# Patient Record
Sex: Male | Born: 1940 | Race: White | Hispanic: No
Health system: Southern US, Community
[De-identification: ages and names within clinical notes are randomized; demographics above are authoritative.]

---

## 2013-09-16 ENCOUNTER — Inpatient Hospital Stay
Admission: AD | Admit: 2013-09-16 | Discharge: 2013-10-06 | Disposition: A | Discharge status: Home Health | Payer: Medicare Other | Source: Ambulatory Visit | Attending: Internal Medicine | Admitting: Internal Medicine

## 2013-09-17 ENCOUNTER — Other Ambulatory Visit (HOSPITAL_COMMUNITY): Payer: Medicare Other

## 2013-09-17 LAB — PROTIME-INR
INR: 1.4 (ref 0.00–1.49)
Prothrombin Time: 17.2 seconds — ABNORMAL HIGH (ref 11.6–15.2)

## 2013-09-17 LAB — CBC
HCT: 38.8 % — ABNORMAL LOW (ref 39.0–52.0)
HEMOGLOBIN: 10.9 g/dL — AB (ref 13.0–17.0)
MCH: 25.2 pg — ABNORMAL LOW (ref 26.0–34.0)
MCHC: 28.1 g/dL — AB (ref 30.0–36.0)
MCV: 89.8 fL (ref 78.0–100.0)
Platelets: 238 10*3/uL (ref 150–400)
RBC: 4.32 MIL/uL (ref 4.22–5.81)
RDW: 24.6 % — ABNORMAL HIGH (ref 11.5–15.5)
WBC: 9.1 10*3/uL (ref 4.0–10.5)

## 2013-09-17 LAB — URINALYSIS, ROUTINE W REFLEX MICROSCOPIC
Bilirubin Urine: NEGATIVE
Glucose, UA: NEGATIVE mg/dL
Hgb urine dipstick: NEGATIVE
Ketones, ur: NEGATIVE mg/dL
NITRITE: NEGATIVE
PH: 5.5 (ref 5.0–8.0)
Protein, ur: NEGATIVE mg/dL
Specific Gravity, Urine: 1.01 (ref 1.005–1.030)
Urobilinogen, UA: 1 mg/dL (ref 0.0–1.0)

## 2013-09-17 LAB — PRO B NATRIURETIC PEPTIDE: PRO B NATRI PEPTIDE: 16632 pg/mL — AB (ref 0–125)

## 2013-09-17 LAB — BLOOD GAS, ARTERIAL
Acid-Base Excess: 6.5 mmol/L — ABNORMAL HIGH (ref 0.0–2.0)
Bicarbonate: 31.2 mEq/L — ABNORMAL HIGH (ref 20.0–24.0)
DELIVERY SYSTEMS: POSITIVE
Expiratory PAP: 6
FIO2: 0.5 %
Inspiratory PAP: 12
Mode: POSITIVE
O2 Saturation: 98.1 %
PATIENT TEMPERATURE: 101.9
PCO2 ART: 56 mmHg — AB (ref 35.0–45.0)
TCO2: 32.8 mmol/L (ref 0–100)
pH, Arterial: 7.375 (ref 7.350–7.450)
pO2, Arterial: 89.1 mmHg (ref 80.0–100.0)

## 2013-09-17 LAB — URINE MICROSCOPIC-ADD ON

## 2013-09-17 LAB — TSH: TSH: 1.32 u[IU]/mL (ref 0.350–4.500)

## 2013-09-17 LAB — HEPATIC FUNCTION PANEL
ALBUMIN: 3.3 g/dL — AB (ref 3.5–5.2)
ALK PHOS: 38 U/L — AB (ref 39–117)
ALT: 35 U/L (ref 0–53)
AST: 28 U/L (ref 0–37)
Bilirubin, Direct: 0.6 mg/dL — ABNORMAL HIGH (ref 0.0–0.3)
Indirect Bilirubin: 0.9 mg/dL (ref 0.3–0.9)
Total Bilirubin: 1.5 mg/dL — ABNORMAL HIGH (ref 0.3–1.2)
Total Protein: 6.8 g/dL (ref 6.0–8.3)

## 2013-09-17 LAB — BASIC METABOLIC PANEL
ANION GAP: 16 — AB (ref 5–15)
BUN: 32 mg/dL — ABNORMAL HIGH (ref 6–23)
CO2: 29 mEq/L (ref 19–32)
Calcium: 9.1 mg/dL (ref 8.4–10.5)
Chloride: 107 mEq/L (ref 96–112)
Creatinine, Ser: 1.32 mg/dL (ref 0.50–1.35)
GFR calc non Af Amer: 52 mL/min — ABNORMAL LOW (ref >90)
GFR, EST AFRICAN AMERICAN: 61 mL/min — AB (ref >90)
Glucose, Bld: 165 mg/dL — ABNORMAL HIGH (ref 70–99)
POTASSIUM: 4.7 meq/L (ref 3.7–5.3)
Sodium: 152 mEq/L — ABNORMAL HIGH (ref 137–147)

## 2013-09-17 LAB — EXPECTORATED SPUTUM ASSESSMENT W GRAM STAIN, RFLX TO RESP C

## 2013-09-17 LAB — EXPECTORATED SPUTUM ASSESSMENT W REFEX TO RESP CULTURE

## 2013-09-17 LAB — DIGOXIN LEVEL: Digoxin Level: <0.3 ng/mL — ABNORMAL LOW (ref 0.8–2.0)

## 2013-09-17 LAB — APTT: APTT: 26 s (ref 24–37)

## 2013-09-18 LAB — BLOOD GAS, ARTERIAL
Acid-Base Excess: 7.7 mmol/L — ABNORMAL HIGH (ref 0.0–2.0)
BICARBONATE: 33.4 meq/L — AB (ref 20.0–24.0)
O2 CONTENT: 5 L/min
O2 SAT: 90.4 %
Patient temperature: 98.7
TCO2: 35.4 mmol/L (ref 0–100)
pCO2 arterial: 64 mmHg (ref 35.0–45.0)
pH, Arterial: 7.338 — ABNORMAL LOW (ref 7.350–7.450)
pO2, Arterial: 66 mmHg — ABNORMAL LOW (ref 80.0–100.0)

## 2013-09-18 LAB — CBC
HCT: 38.5 % — ABNORMAL LOW (ref 39.0–52.0)
Hemoglobin: 10.9 g/dL — ABNORMAL LOW (ref 13.0–17.0)
MCH: 24.9 pg — ABNORMAL LOW (ref 26.0–34.0)
MCHC: 28.3 g/dL — ABNORMAL LOW (ref 30.0–36.0)
MCV: 88.1 fL (ref 78.0–100.0)
Platelets: 224 10*3/uL (ref 150–400)
RBC: 4.37 MIL/uL (ref 4.22–5.81)
RDW: 24.6 % — ABNORMAL HIGH (ref 11.5–15.5)
WBC: 11.6 10*3/uL — ABNORMAL HIGH (ref 4.0–10.5)

## 2013-09-18 LAB — BASIC METABOLIC PANEL
Anion gap: 13 (ref 5–15)
BUN: 33 mg/dL — ABNORMAL HIGH (ref 6–23)
CHLORIDE: 106 meq/L (ref 96–112)
CO2: 30 mEq/L (ref 19–32)
CREATININE: 1.11 mg/dL (ref 0.50–1.35)
Calcium: 8.8 mg/dL (ref 8.4–10.5)
GFR calc Af Amer: 75 mL/min — ABNORMAL LOW (ref >90)
GFR calc non Af Amer: 64 mL/min — ABNORMAL LOW (ref >90)
Glucose, Bld: 122 mg/dL — ABNORMAL HIGH (ref 70–99)
Potassium: 4.5 mEq/L (ref 3.7–5.3)
Sodium: 149 mEq/L — ABNORMAL HIGH (ref 137–147)

## 2013-09-18 LAB — MAGNESIUM: MAGNESIUM: 2.6 mg/dL — AB (ref 1.5–2.5)

## 2013-09-18 LAB — PROCALCITONIN: Procalcitonin: <0.1 ng/mL

## 2013-09-19 LAB — CBC WITH DIFFERENTIAL/PLATELET
BASOS ABS: 0 10*3/uL (ref 0.0–0.1)
Basophils Relative: 0 % (ref 0–1)
EOS PCT: 0 % (ref 0–5)
Eosinophils Absolute: 0 10*3/uL (ref 0.0–0.7)
HCT: 36.3 % — ABNORMAL LOW (ref 39.0–52.0)
Hemoglobin: 10 g/dL — ABNORMAL LOW (ref 13.0–17.0)
Lymphocytes Relative: 2 % — ABNORMAL LOW (ref 12–46)
Lymphs Abs: 0.2 10*3/uL — ABNORMAL LOW (ref 0.7–4.0)
MCH: 24.8 pg — ABNORMAL LOW (ref 26.0–34.0)
MCHC: 27.5 g/dL — ABNORMAL LOW (ref 30.0–36.0)
MCV: 90.1 fL (ref 78.0–100.0)
Monocytes Absolute: 0.4 10*3/uL (ref 0.1–1.0)
Monocytes Relative: 4 % (ref 3–12)
NEUTROS PCT: 94 % — AB (ref 43–77)
Neutro Abs: 10.6 10*3/uL — ABNORMAL HIGH (ref 1.7–7.7)
Platelets: 203 10*3/uL (ref 150–400)
RBC: 4.03 MIL/uL — AB (ref 4.22–5.81)
RDW: 23.8 % — ABNORMAL HIGH (ref 11.5–15.5)
WBC: 11.2 10*3/uL — AB (ref 4.0–10.5)

## 2013-09-19 LAB — MAGNESIUM: Magnesium: 2.7 mg/dL — ABNORMAL HIGH (ref 1.5–2.5)

## 2013-09-19 LAB — BASIC METABOLIC PANEL
ANION GAP: 4 — AB (ref 5–15)
BUN: 25 mg/dL — ABNORMAL HIGH (ref 6–23)
CO2: 34 mEq/L — ABNORMAL HIGH (ref 19–32)
Calcium: 8 mg/dL — ABNORMAL LOW (ref 8.4–10.5)
Chloride: 106 mEq/L (ref 96–112)
Creatinine, Ser: 0.8 mg/dL (ref 0.50–1.35)
GFR, EST NON AFRICAN AMERICAN: 87 mL/min — AB (ref >90)
GLUCOSE: 182 mg/dL — AB (ref 70–99)
POTASSIUM: 4.5 meq/L (ref 3.7–5.3)
Sodium: 144 mEq/L (ref 137–147)

## 2013-09-19 LAB — PHOSPHORUS: PHOSPHORUS: 3.6 mg/dL (ref 2.3–4.6)

## 2013-09-19 LAB — VANCOMYCIN, TROUGH: Vancomycin Tr: 14.8 ug/mL (ref 10.0–20.0)

## 2013-09-19 NOTE — Progress Notes (Signed)
[facility]                                                                                              Progress note     Patient Demographics  Brad Thomas, is a 73 y.o. male  ZOX:[id number]  [id number]:[id number]  DOB - 1940-11-08  Admit date - 09/16/2013  Admitting Physician Elnora Morrison, MD  Outpatient Primary MD for the patient is No primary provider on file.  LOS - 3   CC  Resp failure  GI BLEED  Pna          Subjective:   Brad Thomas is confused  Objective:   Vital signs  Temperature 97.4 Heart rate 47 Respiratory rate 20 Blood pressure 121/82 Pulse ox 99%    Exam Awake confused [employer].AT,PERRAL Supple Neck,No JVD, No cervical lymphadenopathy appriciated.  Symmetrical Chest wall movement, scattered rhonchi  RRR,No Gallops,Rubs or new Murmurs, No Parasternal Heave +ve B.Sounds, Abd Soft, Non tender, No organomegaly appriciated, No rebound - guarding or rigidity. No Cyanosis, Clubbing or edema, No new Rash or bruise     I&Os unknown  Data Review   CBC  Recent Labs Lab 09/17/13 0340 09/18/13 0600 09/19/13 0534  WBC 9.1 11.6* 11.2*  HGB 10.9* 10.9* 10.0*  HCT 38.8* 38.5* 36.3*  PLT 238 224 203  MCV 89.8 88.1 90.1  MCH 25.2* 24.9* 24.8*  MCHC 28.1* 28.3* 27.5*  RDW 24.6* 24.6* 23.8*  LYMPHSABS  --   --  0.2*  MONOABS  --   --  0.4  EOSABS  --   --  0.0  BASOSABS  --   --  0.0    Chemistries   Recent Labs Lab 09/17/13 0340 09/18/13 0600 09/19/13 0534  NA 152* 149* 144  K 4.7 4.5 4.5  CL 107 106 106  CO2 29 30 34*  GLUCOSE 165* 122* 182*  BUN 32* 33* 25*  CREATININE 1.32 1.11 0.80  CALCIUM 9.1 8.8 8.0*  MG  --  2.6* 2.7*  AST 28  --   --   ALT 35  --   --   ALKPHOS 38*  --   --   BILITOT 1.5*  --   --    ------------------------------------------------------------------------------------------------------------------ CrCl is unknown because  there is no height on file for the current visit. ------------------------------------------------------------------------------------------------------------------ No results found for this basename: HGBA1C,  in the last 72 hours ------------------------------------------------------------------------------------------------------------------ No results found for this basename: CHOL, HDL, LDLCALC, TRIG, CHOLHDL, LDLDIRECT,  in the last 72 hours ------------------------------------------------------------------------------------------------------------------  Recent Labs  09/17/13 0340  TSH 1.320   ------------------------------------------------------------------------------------------------------------------ No results found for this basename: VITAMINB12, FOLATE, FERRITIN, TIBC, IRON, RETICCTPCT,  in the last 72 hours  Coagulation profile  Recent Labs Lab 09/17/13 0340  INR 1.40    No results found for this basename: DDIMER,  in the last 72 hours  Cardiac Enzymes No results found for this basename: CK, CKMB, TROPONINI, MYOGLOBIN,  in the last 168 hours ------------------------------------------------------------------------------------------------------------------ No components found with this basename: POCBNP,   Micro Results Recent Results (from the past 240 hour(s))  URINE CULTURE  Status: None   Collection Time    09/17/13  4:16 AM      Result Value Ref Range Status   Specimen Description URINE, CATHETERIZED   Final   Special Requests NONE   Final   Culture  Setup Time     Final   Value: 09/17/2013 12:51     [facility]formed [facility]   Colony Count     Final   Value: >=100,000 COLONIES/ML     [facility]   Culture     Final   Value: GRAM NEGATIVE RODS     [facility]formed [facility]   Report Status PENDING   Incomplete  CULTURE, EXPECTORATED SPUTUM-ASSESSMENT     Status: None   Collection Time    09/17/13  4:28 AM       Result Value Ref Range Status   Specimen Description SPUTUM   Final   Special Requests NONE   Final   Sputum evaluation     Final   Value: MICROSCOPIC FINDINGS SUGGEST THAT THIS SPECIMEN IS NOT REPRESENTATIVE OF LOWER RESPIRATORY SECRETIONS. PLEASE RECOLLECT.     NOTIFIED TO Purvis KiltsK. LOLLING (RN) [id number] 09/17/2013 L. LOMAX   Report Status 09/17/2013 FINAL   Final  CULTURE, BLOOD (SINGLE)     Status: None   Collection Time    09/17/13  5:10 AM      Result Value Ref Range Status   Specimen Description BLOOD RIGHT ANTECUBITAL   Final   Special Requests BOTTLES DRAWN AEROBIC ONLY 10CC   Final   Culture  Setup Time     Final   Value: 09/17/2013 11:07     [facility]formed at [facility]   Culture     Final   Value:        BLOOD CULTURE RECEIVED NO GROWTH TO DATE CULTURE WILL BE HELD FOR 5 DAYS BEFORE ISSUING A FINAL NEGATIVE REPORT     Performed at [facility]   Report Status PENDING   Incomplete  CULTURE, BLOOD (SINGLE)     Status: None   Collection Time    09/17/13  5:25 AM      Result Value Ref Range Status   Specimen Description BLOOD RIGHT HAND   Final   Special Requests BOTTLES DRAWN AEROBIC ONLY 9CC   Final   Culture  Setup Time     Final   Value: 09/17/2013 11:07     Performed at [facility]   Culture     Final   Value:        BLOOD CULTURE RECEIVED NO GROWTH TO DATE CULTURE WILL BE HELD FOR 5 DAYS BEFORE ISSUING A FINAL NEGATIVE REPORT     Performed at [facility]   Report Status PENDING   Incomplete       Assessment & Plan    Respiratory failure on nasal cannula 6 L per minute Aspiration pneumonia on IV antibiotics UTI on IV antibiotics Congestive heart failure with left ventricular dysfunction continue with beta blocker as digoxin and ACE inhibitors Severe anemia due to hemorrhagic gastritis with xarelto Advanced COPD oxygen dependent Delirium continue with olanzapine and Haldol Pulmonary embolism status post IVC filter not an anticoagulation  candidate Hyponatremia on D5 water resolved Coronary artery disease status post MI in 2011 with multiple stents Fever Seizure on Depakote Bradycardia  Plan  Hold digoxin Check BMP in a.m. D/C D5W    Code Status: Full     DVT  Prophylaxis  SCDs   Carron CurieHijazi, Cena Bruhn M.D on 09/19/2013 at 2:14 PM

## 2013-09-20 LAB — BASIC METABOLIC PANEL
Anion gap: 8 (ref 5–15)
BUN: 25 mg/dL — AB (ref 6–23)
CHLORIDE: 106 meq/L (ref 96–112)
CO2: 31 mEq/L (ref 19–32)
Calcium: 8.1 mg/dL — ABNORMAL LOW (ref 8.4–10.5)
Creatinine, Ser: 0.77 mg/dL (ref 0.50–1.35)
GFR calc non Af Amer: 89 mL/min — ABNORMAL LOW (ref >90)
Glucose, Bld: 197 mg/dL — ABNORMAL HIGH (ref 70–99)
POTASSIUM: 4.3 meq/L (ref 3.7–5.3)
SODIUM: 145 meq/L (ref 137–147)

## 2013-09-20 LAB — URINE CULTURE

## 2013-09-20 NOTE — Progress Notes (Addendum)
[facility]                                                                                              Progress note     Patient Demographics  Brad Thomas, is a 73 y.o. male  AOZ:[id number]  [id number]  DOB - [birth year]  Admit date - 09/16/2013  Admitting Physician Elnora MorrisonAhmad B Barakat, MD  Outpatient Primary MD for the patient is No primary provider on file.  LOS - 4   CC  Resp failure  GI BLEED  Pna          Subjective:   Brad Thomas is confused  Objective:   Vital signs  Temperature98 Heart rate 101 Respiratory rate 20 Blood pressure 140/62 Pulse ox 99%    Exam Awake confused [place].AT,PERRAL Supple Neck,No JVD, No cervical lymphadenopathy appriciated.  Symmetrical Chest wall movement, scattered rhonchi  RRR,No Gallops,Rubs or new Murmurs, No Parasternal Heave +ve B.Sounds, Abd Soft, Non tender, No organomegaly appriciated, No rebound - guarding or rigidity. No Cyanosis, Clubbing or edema, No new Rash or bruise     I&Os +470  Data Review   CBC  Recent Labs Lab 09/17/13 0340 09/18/13 0600 09/19/13 0534  WBC 9.1 11.6* 11.2*  HGB 10.9* 10.9* 10.0*  HCT 38.8* 38.5* 36.3*  PLT 238 224 203  MCV 89.8 88.1 90.1  MCH 25.2* 24.9* 24.8*  MCHC 28.1* 28.3* 27.5*  RDW 24.6* 24.6* 23.8*  LYMPHSABS  --   --  0.2*  MONOABS  --   --  0.4  EOSABS  --   --  0.0  BASOSABS  --   --  0.0    Chemistries   Recent Labs Lab 09/17/13 0340 09/18/13 0600 09/19/13 0534 09/20/13 0925  NA 152* 149* 144 145  K 4.7 4.5 4.5 4.3  CL 107 106 106 106  CO2 29 30 34* 31  GLUCOSE 165* 122* 182* 197*  BUN 32* 33* 25* 25*  CREATININE 1.32 1.11 0.80 0.77  CALCIUM 9.1 8.8 8.0* 8.1*  MG  --  2.6* 2.7*  --   AST 28  --   --   --   ALT 35  --   --   --   ALKPHOS 38*  --   --   --   BILITOT 1.5*  --   --   --     ------------------------------------------------------------------------------------------------------------------ CrCl is unknown because there is no height on file for the current visit. ------------------------------------------------------------------------------------------------------------------ No results found for this basename: HGBA1C,  in the last 72 hours ------------------------------------------------------------------------------------------------------------------ No results found for this basename: CHOL, HDL, LDLCALC, TRIG, CHOLHDL, LDLDIRECT,  in the last 72 hours ------------------------------------------------------------------------------------------------------------------ No results found for this basename: TSH, T4TOTAL, FREET3, T3FREE, THYROIDAB,  in the last 72 hours ------------------------------------------------------------------------------------------------------------------ No results found for this basename: VITAMINB12, FOLATE, FERRITIN, TIBC, IRON, RETICCTPCT,  in the last 72 hours  Coagulation profile  Recent Labs Lab 09/17/13 0340  INR 1.40    No results found for this basename: DDIMER,  in the last 72 hours  Cardiac Enzymes No results found for this basename: CK, CKMB, TROPONINI,  MYOGLOBIN,  in the last 168 hours ------------------------------------------------------------------------------------------------------------------ No components found with this basename: POCBNP,   Micro Results Recent Results (from the past 240 hour(s))  URINE CULTURE     Status: None   Collection Time    09/17/13  4:16 AM      Result Value Ref Range Status   Specimen Description URINE, CATHETERIZED   Final   Special Requests NONE   Final   Culture  Setup Time     Final   Value: 09/17/2013 12:51     Performed at [facility] Count     Final   Value: >=100,000 COLONIES/ML     Performed at [facility]   Culture     Final   Value:  KLEBSIELLA PNEUMONIAE     Performed at [facility]   Report Status 09/20/2013 FINAL   Final   Organism ID, Bacteria KLEBSIELLA PNEUMONIAE   Final  CULTURE, EXPECTORATED SPUTUM-ASSESSMENT     Status: None   Collection Time    09/17/13  4:28 AM      Result Value Ref Range Status   Specimen Description SPUTUM   Final   Special Requests NONE   Final   Sputum evaluation     Final   Value: MICROSCOPIC FINDINGS SUGGEST THAT THIS SPECIMEN IS NOT REPRESENTATIVE OF LOWER RESPIRATORY SECRETIONS. PLEASE RECOLLECT.     NOTIFIED TO Purvis Kilts (RN) [id number] 09/17/2013 L. LOMAX   Report Status 09/17/2013 FINAL   Final  CULTURE, BLOOD (SINGLE)     Status: None   Collection Time    09/17/13  5:10 AM      Result Value Ref Range Status   Specimen Description BLOOD RIGHT ANTECUBITAL   Final   Special Requests BOTTLES DRAWN AEROBIC ONLY 10CC   Final   Culture  Setup Time     Final   Value: 09/17/2013 11:07     Performed at [facility]   Culture     Final   Value:        BLOOD CULTURE RECEIVED NO GROWTH TO DATE CULTURE WILL BE HELD FOR 5 DAYS BEFORE ISSUING A FINAL NEGATIVE REPORT     Performed at [facility]   Report Status PENDING   Incomplete  CULTURE, BLOOD (SINGLE)     Status: None   Collection Time    09/17/13  5:25 AM      Result Value Ref Range Status   Specimen Description BLOOD RIGHT HAND   Final   Special Requests BOTTLES DRAWN AEROBIC ONLY 9CC   Final   Culture  Setup Time     Final   Value: 09/17/2013 11:07     Performed at [facility]   Culture     Final   Value:        BLOOD CULTURE RECEIVED NO GROWTH TO DATE CULTURE WILL BE HELD FOR 5 DAYS BEFORE ISSUING A FINAL NEGATIVE REPORT     Performed at [facility]   Report Status PENDING   Incomplete       Assessment & Plan    Respiratory failure on nasal cannula 6 L per minute Aspiration pneumonia on IV antibiotics UTI on IV antibiotics Congestive heart failure with left ventricular  dysfunction continue with beta blocker as digoxin and ACE inhibitors Severe anemia due to hemorrhagic gastritis with xarelto Advanced COPD oxygen dependent Delirium continue with olanzapine and Haldol Pulmonary embolism status post IVC filter not an anticoagulation candidate  Hyponatremia on D5 water resolved Coronary artery disease status post MI in 2011 with multiple stents Fever Seizure on Depakote Bradycardia digoxin is decreased  Plan  D/W ID Discontinue meropenem and vancomycin Start Rocephin 1 g IV daily Discontinue Foley Critical care time 32 minutes  Code Status: Full     DVT Prophylaxis  SCDs   Carron CurieHijazi, Helmi Hechavarria M.D on 09/20/2013 at 1:23 PM

## 2013-09-21 LAB — PROTEIN ELECTROPHORESIS, SERUM
ALBUMIN ELP: 53.7 % — AB (ref 55.8–66.1)
Alpha-1-Globulin: 4.5 % (ref 2.9–4.9)
Alpha-2-Globulin: 5.3 % — ABNORMAL LOW (ref 7.1–11.8)
BETA 2: 16 % — AB (ref 3.2–6.5)
Beta Globulin: 10 % — ABNORMAL HIGH (ref 4.7–7.2)
Gamma Globulin: 10.5 % — ABNORMAL LOW (ref 11.1–18.8)
M-Spike, %: NOT DETECTED g/dL
TOTAL PROTEIN ELP: 5.3 g/dL — AB (ref 6.0–8.3)

## 2013-09-21 LAB — VANCOMYCIN, TROUGH: VANCOMYCIN TR: 11.1 ug/mL (ref 10.0–20.0)

## 2013-09-21 NOTE — Progress Notes (Signed)
[facility]                                                                                              Progress note     Patient Demographics  Brad Thomas, is a 73 y.o. male  ZOX:[id number]  WJX:[id number]  DOB - [birth year]  Admit date - 09/16/2013  Admitting Physician Elnora MorrisonAhmad B Barakat, MD  Outpatient Primary MD for the patient is No primary provider on file.  LOS - 5   CC  Resp failure  GI BLEED  Pna          Subjective:   Brad Thomas is confused  Objective:   Vital signs  Temperature 98.4 Heart rate 64 Respiratory rate 18 Blood pressure 157/75 Pulse ox 93%    Exam Awake confused [place].AT,PERRAL Supple Neck,No JVD, No cervical lymphadenopathy appriciated.  Symmetrical Chest wall movement, decreased breath sounds, no rhonchi or wheezing  RRR,No Gallops,Rubs or new Murmurs, No Parasternal Heave +ve B.Sounds, Abd Soft, Non tender, No organomegaly appriciated, No rebound - guarding or rigidity. No Cyanosis, Clubbing or edema, No new Rash or bruise     I&Os -1200  Data Review   CBC  Recent Labs Lab 09/17/13 0340 09/18/13 0600 09/19/13 0534  WBC 9.1 11.6* 11.2*  HGB 10.9* 10.9* 10.0*  HCT 38.8* 38.5* 36.3*  PLT 238 224 203  MCV 89.8 88.1 90.1  MCH 25.2* 24.9* 24.8*  MCHC 28.1* 28.3* 27.5*  RDW 24.6* 24.6* 23.8*  LYMPHSABS  --   --  0.2*  MONOABS  --   --  0.4  EOSABS  --   --  0.0  BASOSABS  --   --  0.0    Chemistries   Recent Labs Lab 09/17/13 0340 09/18/13 0600 09/19/13 0534 09/20/13 0925  NA 152* 149* 144 145  K 4.7 4.5 4.5 4.3  CL 107 106 106 106  CO2 29 30 34* 31  GLUCOSE 165* 122* 182* 197*  BUN 32* 33* 25* 25*  CREATININE 1.32 1.11 0.80 0.77  CALCIUM 9.1 8.8 8.0* 8.1*  MG  --  2.6* 2.7*  --   AST 28  --   --   --   ALT 35  --   --   --   ALKPHOS 38*  --   --   --   BILITOT 1.5*  --   --   --     ------------------------------------------------------------------------------------------------------------------ CrCl is unknown because there is no height on file for the current visit. ------------------------------------------------------------------------------------------------------------------ No results found for this basename: HGBA1C,  in the last 72 hours ------------------------------------------------------------------------------------------------------------------ No results found for this basename: CHOL, HDL, LDLCALC, TRIG, CHOLHDL, LDLDIRECT,  in the last 72 hours ------------------------------------------------------------------------------------------------------------------ No results found for this basename: TSH, T4TOTAL, FREET3, T3FREE, THYROIDAB,  in the last 72 hours ------------------------------------------------------------------------------------------------------------------ No results found for this basename: VITAMINB12, FOLATE, FERRITIN, TIBC, IRON, RETICCTPCT,  in the last 72 hours  Coagulation profile  Recent Labs Lab 09/17/13 0340  INR 1.40    No results found for this basename: DDIMER,  in the last 72 hours  Cardiac Enzymes No results found  for this basename: CK, CKMB, TROPONINI, MYOGLOBIN,  in the last 168 hours ------------------------------------------------------------------------------------------------------------------ No components found with this basename: POCBNP,   Micro Results Recent Results (from the past 240 hour(s))  URINE CULTURE     Status: None   Collection Time    09/17/13  4:16 AM      Result Value Ref Range Status   Specimen Description URINE, CATHETERIZED   Final   Special Requests NONE   Final   Culture  Setup Time     Final   Value: 09/17/2013 12:51     Performed at [facility]   Colony Count     Final   Value: >=100,000 COLONIES/ML     Performed at [facility]   Culture     Final   Value:  KLEBSIELLA PNEUMONIAE     Performed at [facility]   Report Status 09/20/2013 FINAL   Final   Organism ID, Bacteria KLEBSIELLA PNEUMONIAE   Final  CULTURE, EXPECTORATED SPUTUM-ASSESSMENT     Status: None   Collection Time    09/17/13  4:28 AM      Result Value Ref Range Status   Specimen Description SPUTUM   Final   Special Requests NONE   Final   Sputum evaluation     Final   Value: MICROSCOPIC FINDINGS SUGGEST THAT THIS SPECIMEN IS NOT REPRESENTATIVE OF LOWER RESPIRATORY SECRETIONS. PLEASE RECOLLECT.     NOTIFIED TO Purvis KiltsK. LOLLING (RN) [phone] 09/17/2013 L. LOMAX   Report Status 09/17/2013 FINAL   Final  CULTURE, BLOOD (SINGLE)     Status: None   Collection Time    09/17/13  5:10 AM      Result Value Ref Range Status   Specimen Description BLOOD RIGHT ANTECUBITAL   Final   Special Requests BOTTLES DRAWN AEROBIC ONLY 10CC   Final   Culture  Setup Time     Final   Value: 09/17/2013 11:07     Performed at [facility]   Culture     Final   Value:        BLOOD CULTURE RECEIVED NO GROWTH TO DATE CULTURE WILL BE HELD FOR 5 DAYS BEFORE ISSUING A FINAL NEGATIVE REPORT     Performed at [facility]   Report Status PENDING   Incomplete  CULTURE, BLOOD (SINGLE)     Status: None   Collection Time    09/17/13  5:25 AM      Result Value Ref Range Status   Specimen Description BLOOD RIGHT HAND   Final   Special Requests BOTTLES DRAWN AEROBIC ONLY 9CC   Final   Culture  Setup Time     Final   Value: 09/17/2013 11:07     Performed at [facility]   Culture     Final   Value:        BLOOD CULTURE RECEIVED NO GROWTH TO DATE CULTURE WILL BE HELD FOR 5 DAYS BEFORE ISSUING A FINAL NEGATIVE REPORT     Performed at [facility]   Report Status PENDING   Incomplete       Assessment & Plan    Respiratory failure on nasal cannula 6 L per minute Aspiration pneumonia on IV antibiotics UTI on IV antibiotics Congestive heart failure with left ventricular  dysfunction continue with beta blocker as digoxin and ACE inhibitors Severe anemia due to hemorrhagic gastritis with xarelto Advanced COPD oxygen dependent Delirium continue with olanzapine and Haldol Pulmonary embolism status post  IVC filter not an anticoagulation candidate Hyponatremia on D5 water resolved Coronary artery disease status post MI in 2011 with multiple stents Fever Seizure on Depakote Bradycardia digoxin is decreased  Plan  Continue same  Code Status: Full     DVT Prophylaxis  SCDs   Carron Curie M.D on 09/21/2013 at 1:15 PM

## 2013-09-22 ENCOUNTER — Other Ambulatory Visit (HOSPITAL_COMMUNITY): Payer: Medicare Other

## 2013-09-22 LAB — CBC
HCT: 36.7 % — ABNORMAL LOW (ref 39.0–52.0)
Hemoglobin: 10.6 g/dL — ABNORMAL LOW (ref 13.0–17.0)
MCH: 24.9 pg — ABNORMAL LOW (ref 26.0–34.0)
MCHC: 28.9 g/dL — ABNORMAL LOW (ref 30.0–36.0)
MCV: 86.2 fL (ref 78.0–100.0)
Platelets: 242 10*3/uL (ref 150–400)
RBC: 4.26 MIL/uL (ref 4.22–5.81)
RDW: 24.1 % — AB (ref 11.5–15.5)
WBC: 11.5 10*3/uL — ABNORMAL HIGH (ref 4.0–10.5)

## 2013-09-22 LAB — BASIC METABOLIC PANEL
ANION GAP: 8 (ref 5–15)
BUN: 24 mg/dL — ABNORMAL HIGH (ref 6–23)
CO2: 32 meq/L (ref 19–32)
CREATININE: 0.86 mg/dL (ref 0.50–1.35)
Calcium: 8.3 mg/dL — ABNORMAL LOW (ref 8.4–10.5)
Chloride: 100 mEq/L (ref 96–112)
GFR calc non Af Amer: 85 mL/min — ABNORMAL LOW (ref >90)
Glucose, Bld: 86 mg/dL (ref 70–99)
Potassium: 4.2 mEq/L (ref 3.7–5.3)
Sodium: 140 mEq/L (ref 137–147)

## 2013-09-22 NOTE — Progress Notes (Signed)
[facility]                                                                                              Progress note     Patient Demographics  Brad Thomas, is a 73 y.o. male  ZOX:[id number]  WJX:[id number]  DOB - April 20, 1940  Admit date - 09/16/2013  Admitting Physician Elnora Morrison, MD  Outpatient Primary MD for the patient is No primary provider on file.  LOS - 6   CC  Resp failure  GI BLEED  Pna          Subjective:   Brad Thomas is confused  Objective:   Vital signs  Temperature 98.1 Heart rate 54 Respiratory rate 18 Blood pressure 122/70 Pulse ox 95%    Exam Awake confused [place].AT,PERRAL Supple Neck,No JVD, No cervical lymphadenopathy appriciated.  Symmetrical Chest wall movement, decreased breath sounds, no rhonchi or wheezing  RRR,No Gallops,Rubs or new Murmurs, No Parasternal Heave +ve B.Sounds, Abd Soft, Non tender, No organomegaly appriciated, No rebound - guarding or rigidity. No Cyanosis, Clubbing or edema, No new Rash or bruise     I&Os -1200  Data Review   CBC  Recent Labs Lab 09/17/13 0340 09/18/13 0600 09/19/13 0534 09/22/13 0621  WBC 9.1 11.6* 11.2* 11.5*  HGB 10.9* 10.9* 10.0* 10.6*  HCT 38.8* 38.5* 36.3* 36.7*  PLT 238 224 203 242  MCV 89.8 88.1 90.1 86.2  MCH 25.2* 24.9* 24.8* 24.9*  MCHC 28.1* 28.3* 27.5* 28.9*  RDW 24.6* 24.6* 23.8* 24.1*  LYMPHSABS  --   --  0.2*  --   MONOABS  --   --  0.4  --   EOSABS  --   --  0.0  --   BASOSABS  --   --  0.0  --     Chemistries   Recent Labs Lab 09/17/13 0340 09/18/13 0600 09/19/13 0534 09/20/13 0925 09/22/13 0621  NA 152* 149* 144 145 140  K 4.7 4.5 4.5 4.3 4.2  CL 107 106 106 106 100  CO2 29 30 34* 31 32  GLUCOSE 165* 122* 182* 197* 86  BUN 32* 33* 25* 25* 24*  CREATININE 1.32 1.11 0.80 0.77 0.86  CALCIUM 9.1 8.8 8.0* 8.1* 8.3*  MG  --  2.6* 2.7*  --   --   AST 28  --   --    --   --   ALT 35  --   --   --   --   ALKPHOS 38*  --   --   --   --   BILITOT 1.5*  --   --   --   --    ------------------------------------------------------------------------------------------------------------------ CrCl is unknown because there is no height on file for the current visit. ------------------------------------------------------------------------------------------------------------------ No results found for this basename: HGBA1C,  in the last 72 hours ------------------------------------------------------------------------------------------------------------------ No results found for this basename: CHOL, HDL, LDLCALC, TRIG, CHOLHDL, LDLDIRECT,  in the last 72 hours ------------------------------------------------------------------------------------------------------------------ No results found for this basename: TSH, T4TOTAL, FREET3, T3FREE, THYROIDAB,  in the last 72 hours ------------------------------------------------------------------------------------------------------------------ No results found for this  basename: VITAMINB12, FOLATE, FERRITIN, TIBC, IRON, RETICCTPCT,  in the last 72 hours  Coagulation profile  Recent Labs Lab 09/17/13 0340  INR 1.40    No results found for this basename: DDIMER,  in the last 72 hours  Cardiac Enzymes No results found for this basename: CK, CKMB, TROPONINI, MYOGLOBIN,  in the last 168 hours ------------------------------------------------------------------------------------------------------------------ No components found with this basename: POCBNP,   Micro Results Recent Results (from the past 240 hour(s))  URINE CULTURE     Status: None   Collection Time    09/17/13  4:16 AM      Result Value Ref Range Status   Specimen Description URINE, CATHETERIZED   Final   Special Requests NONE   Final   Culture  Setup Time     Final   Value: 09/17/2013 12:51     Performed at [facility] Count     Final    Value: >=100,000 COLONIES/ML     Performed at [facility]   Culture     Final   Value: KLEBSIELLA PNEUMONIAE     Performed at [facility]   Report Status 09/20/2013 FINAL   Final   Organism ID, Bacteria KLEBSIELLA PNEUMONIAE   Final  CULTURE, EXPECTORATED SPUTUM-ASSESSMENT     Status: None   Collection Time    09/17/13  4:28 AM      Result Value Ref Range Status   Specimen Description SPUTUM   Final   Special Requests NONE   Final   Sputum evaluation     Final   Value: MICROSCOPIC FINDINGS SUGGEST THAT THIS SPECIMEN IS NOT REPRESENTATIVE OF LOWER RESPIRATORY SECRETIONS. PLEASE RECOLLECT.     NOTIFIED TO Purvis Kilts (RN) [phone] 09/17/2013 L. LOMAX   Report Status 09/17/2013 FINAL   Final  CULTURE, BLOOD (SINGLE)     Status: None   Collection Time    09/17/13  5:10 AM      Result Value Ref Range Status   Specimen Description BLOOD RIGHT ANTECUBITAL   Final   Special Requests BOTTLES DRAWN AEROBIC ONLY 10CC   Final   Culture  Setup Time     Final   Value: 09/17/2013 11:07     Performed at [facility]   Culture     Final   Value:        BLOOD CULTURE RECEIVED NO GROWTH TO DATE CULTURE WILL BE HELD FOR 5 DAYS BEFORE ISSUING A FINAL NEGATIVE REPORT     Performed at [facility]   Report Status PENDING   Incomplete  CULTURE, BLOOD (SINGLE)     Status: None   Collection Time    09/17/13  5:25 AM      Result Value Ref Range Status   Specimen Description BLOOD RIGHT HAND   Final   Special Requests BOTTLES DRAWN AEROBIC ONLY 9CC   Final   Culture  Setup Time     Final   Value: 09/17/2013 11:07     Performed at [facility]   Culture     Final   Value:        BLOOD CULTURE RECEIVED NO GROWTH TO DATE CULTURE WILL BE HELD FOR 5 DAYS BEFORE ISSUING A FINAL NEGATIVE REPORT     Performed at [facility]   Report Status PENDING   Incomplete       Assessment & Plan    Respiratory failure on nasal cannula 6 L per minute Aspiration  pneumonia on IV antibiotics UTI on IV antibiotics Congestive heart failure with left ventricular dysfunction continue with beta blocker as digoxin and ACE inhibitors ,& diuresis Severe anemia due to hemorrhagic gastritis with xarelto Advanced COPD oxygen dependent Delirium continue with olanzapine and Haldol Pulmonary embolism status post IVC filter not an anticoagulation candidate Hyponatremia on D5 water resolved Coronary artery disease status post MI in 2011 with multiple stents Fever Seizure on Depakote Bradycardia ; digoxin is decreased  Plan  Continue same  Code Status: Full  DVT Prophylaxis  SCDs   Carron CurieHijazi, Medora Roorda M.D on 09/22/2013 at 12:32 PM

## 2013-09-23 LAB — CULTURE, BLOOD (SINGLE)
CULTURE: NO GROWTH
Culture: NO GROWTH

## 2013-09-23 NOTE — Progress Notes (Signed)
[facility]                                                                                              Progress note     Patient Demographics  Brad Thomas, is a 73 y.o. male  ZOX:[id number]  WJX:[id number]  DOB - 1941-02-12  Admit date - 09/16/2013  Admitting Physician Elnora Morrison, MD  Outpatient Primary MD for the patient is No primary provider on file.  LOS - 7   CC  Resp failure  GI BLEED  Pna          Subjective:   Brad Thomas has no complaints, family at bedside  Objective:   Vital signs  Temperature 96.9 Heart rate 63 Respiratory rate 20 Blood pressure 117/77 Pulse ox 98%    Exam Awake confused [place].AT,PERRAL Supple Neck,No JVD, No cervical lymphadenopathy appriciated.  Symmetrical Chest wall movement, decreased breath sounds, no rhonchi or wheezing  RRR,No Gallops,Rubs or new Murmurs, No Parasternal Heave +ve B.Sounds, Abd Soft, Non tender, No organomegaly appriciated, No rebound - guarding or rigidity. No Cyanosis, Clubbing or edema, No new Rash or bruise     I&Os -1200  Data Review   CBC  Recent Labs Lab 09/17/13 0340 09/18/13 0600 09/19/13 0534 09/22/13 0621  WBC 9.1 11.6* 11.2* 11.5*  HGB 10.9* 10.9* 10.0* 10.6*  HCT 38.8* 38.5* 36.3* 36.7*  PLT 238 224 203 242  MCV 89.8 88.1 90.1 86.2  MCH 25.2* 24.9* 24.8* 24.9*  MCHC 28.1* 28.3* 27.5* 28.9*  RDW 24.6* 24.6* 23.8* 24.1*  LYMPHSABS  --   --  0.2*  --   MONOABS  --   --  0.4  --   EOSABS  --   --  0.0  --   BASOSABS  --   --  0.0  --     Chemistries   Recent Labs Lab 09/17/13 0340 09/18/13 0600 09/19/13 0534 09/20/13 0925 09/22/13 0621  NA 152* 149* 144 145 140  K 4.7 4.5 4.5 4.3 4.2  CL 107 106 106 106 100  CO2 29 30 34* 31 32  GLUCOSE 165* 122* 182* 197* 86  BUN 32* 33* 25* 25* 24*  CREATININE 1.32 1.11 0.80 0.77 0.86  CALCIUM 9.1 8.8 8.0* 8.1* 8.3*  MG  --  2.6* 2.7*  --    --   AST 28  --   --   --   --   ALT 35  --   --   --   --   ALKPHOS 38*  --   --   --   --   BILITOT 1.5*  --   --   --   --    ------------------------------------------------------------------------------------------------------------------ CrCl is unknown because there is no height on file for the current visit. ------------------------------------------------------------------------------------------------------------------ No results found for this basename: HGBA1C,  in the last 72 hours ------------------------------------------------------------------------------------------------------------------ No results found for this basename: CHOL, HDL, LDLCALC, TRIG, CHOLHDL, LDLDIRECT,  in the last 72 hours ------------------------------------------------------------------------------------------------------------------ No results found for this basename: TSH, T4TOTAL, FREET3, T3FREE, THYROIDAB,  in the last 72 hours ------------------------------------------------------------------------------------------------------------------ No  results found for this basename: VITAMINB12, FOLATE, FERRITIN, TIBC, IRON, RETICCTPCT,  in the last 72 hours  Coagulation profile  Recent Labs Lab 09/17/13 0340  INR 1.40    No results found for this basename: DDIMER,  in the last 72 hours  Cardiac Enzymes No results found for this basename: CK, CKMB, TROPONINI, MYOGLOBIN,  in the last 168 hours ------------------------------------------------------------------------------------------------------------------ No components found with this basename: POCBNP,   Micro Results Recent Results (from the past 240 hour(s))  URINE CULTURE     Status: None   Collection Time    09/17/13  4:16 AM      Result Value Ref Range Status   Specimen Description URINE, CATHETERIZED   Final   Special Requests NONE   Final   Culture  Setup Time     Final   Value: 09/17/2013 12:51     Performed at [facility]    Colony Count     Final   Value: >=100,000 COLONIES/ML     [facility]   Culture     Final   Value: KLEBSIELLA PNEUMONIAE     [facility]formed [facility]   Report Status 09/20/2013 FINAL   Final   Organism ID, Bacteria KLEBSIELLA PNEUMONIAE   Final  CULTURE, EXPECTORATED SPUTUM-ASSESSMENT     Status: None   Collection Time    09/17/13  4:28 AM      Result Value Ref Range Status   Specimen Description SPUTUM   Final   Special Requests NONE   Final   Sputum evaluation     Final   Value: MICROSCOPIC FINDINGS SUGGEST THAT THIS SPECIMEN IS NOT REPRESENTATIVE OF LOWER RESPIRATORY SECRETIONS. PLEASE RECOLLECT.     NOTIFIED TO Purvis KiltsK. LOLLING (RN) [phone] 09/17/2013 L. LOMAX   Report Status 09/17/2013 FINAL   Final  CULTURE, BLOOD (SINGLE)     Status: None   Collection Time    09/17/13  5:10 AM      Result Value Ref Range Status   Specimen Description BLOOD RIGHT ANTECUBITAL   Final   Special Requests BOTTLES DRAWN AEROBIC ONLY 10CC   Final   Culture  Setup Time     Final   Value: 09/17/2013 11:07     [facility]formed [facility]   Culture     Final   Value: NO GROWTH 5 DAYS     [facility]formed at [facility]   Report Status 09/23/2013 FINAL   Final  CULTURE, BLOOD (SINGLE)     Status: None   Collection Time    09/17/13  5:25 AM      Result Value Ref Range Status   Specimen Description BLOOD RIGHT HAND   Final   Special Requests BOTTLES DRAWN AEROBIC ONLY 9CC   Final   Culture  Setup Time     Final   Value: 09/17/2013 11:07     [facility]formed [facility]   Culture     Final   Value: NO GROWTH 5 DAYS     [facility]   Report Status 09/23/2013 FINAL   Final       Assessment & Plan    Respiratory failure on nasal cannula 6 L per minute Aspiration pneumonia on IV antibiotics UTI on IV antibiotics 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

## 2013-09-24 NOTE — Progress Notes (Signed)
[facility]                                                                                              Progress note     Patient Demographics  Brad Thomas, is a 73 y.o. male  BJY:[id number]  YQM:[id number]  DOB - 18-Oct-1940  Admit date - 09/16/2013  Admitting Physician Elnora MorrisonAhmad B Barakat, MD  Outpatient Primary MD for the patient is No primary provider on file.  LOS - 8   CC  Resp failure  GI BLEED  Pna          Subjective:   Brad Thomas has no complaints, family at bedside  Objective:   Vital signs  Temperature 97.6 Heart rate 50 Respiratory rate 20 Blood pressure 121/60 Pulse ox 97%    Exam Awake confused .AT,PERRAL Supple Neck,No JVD, No cervical lymphadenopathy appriciated.  Symmetrical Chest wall movement, decreased breath sounds, no rhonchi or wheezing  RRR,No Gallops,Rubs or new Murmurs, No Parasternal Heave +ve B.Sounds, Abd Soft, Non tender, No organomegaly appriciated, No rebound - guarding or rigidity. No Cyanosis, Clubbing or edema, No new Rash or bruise     I&Os -580  Data Review   CBC  Recent Labs Lab 09/18/13 0600 09/19/13 0534 09/22/13 0621  WBC 11.6* 11.2* 11.5*  HGB 10.9* 10.0* 10.6*  HCT 38.5* 36.3* 36.7*  PLT 224 203 242  MCV 88.1 90.1 86.2  MCH 24.9* 24.8* 24.9*  MCHC 28.3* 27.5* 28.9*  RDW 24.6* 23.8* 24.1*  LYMPHSABS  --  0.2*  --   MONOABS  --  0.4  --   EOSABS  --  0.0  --   BASOSABS  --  0.0  --     Chemistries   Recent Labs Lab 09/18/13 0600 09/19/13 0534 09/20/13 0925 09/22/13 0621  NA 149* 144 145 140  K 4.5 4.5 4.3 4.2  CL 106 106 106 100  CO2 30 34* 31 32  GLUCOSE 122* 182* 197* 86  BUN 33* 25* 25* 24*  CREATININE 1.11 0.80 0.77 0.86  CALCIUM 8.8 8.0* 8.1* 8.3*  MG 2.6* 2.7*  --   --    ------------------------------------------------------------------------------------------------------------------ CrCl is  unknown because there is no height on file for the current visit. ------------------------------------------------------------------------------------------------------------------ No results found for this basename: HGBA1C,  in the last 72 hours ------------------------------------------------------------------------------------------------------------------ No results found for this basename: CHOL, HDL, LDLCALC, TRIG, CHOLHDL, LDLDIRECT,  in the last 72 hours ------------------------------------------------------------------------------------------------------------------ No results found for this basename: TSH, T4TOTAL, FREET3, T3FREE, THYROIDAB,  in the last 72 hours ------------------------------------------------------------------------------------------------------------------ No results found for this basename: VITAMINB12, FOLATE, FERRITIN, TIBC, IRON, RETICCTPCT,  in the last 72 hours  Coagulation profile No results found for this basename: INR, PROTIME,  in the last 168 hours  No results found for this basename: DDIMER,  in the last 72 hours  Cardiac Enzymes No results found for this basename: CK, CKMB, TROPONINI, MYOGLOBIN,  in the last 168 hours ------------------------------------------------------------------------------------------------------------------ No components found with this basename: POCBNP,   Micro Results Recent Results (from the past 240 hour(s))  URINE CULTURE     Status: None  Collection Time    09/17/13  4:16 AM      Result Value Ref Range Status   Specimen Description URINE, CATHETERIZED   Final   Special Requests NONE   Final   Culture  Setup Time     Final   Value: 09/17/2013 12:51     Performed at [facility] Count     Final   Value: >=100,000 COLONIES/ML     Performed at [facility]   Culture     Final   Value: KLEBSIELLA PNEUMONIAE     Performed at [facility]   Report Status 09/20/2013 FINAL   Final    Organism ID, Bacteria KLEBSIELLA PNEUMONIAE   Final  CULTURE, EXPECTORATED SPUTUM-ASSESSMENT     Status: None   Collection Time    09/17/13  4:28 AM      Result Value Ref Range Status   Specimen Description SPUTUM   Final   Special Requests NONE   Final   Sputum evaluation     Final   Value: MICROSCOPIC FINDINGS SUGGEST THAT THIS SPECIMEN IS NOT REPRESENTATIVE OF LOWER RESPIRATORY SECRETIONS. PLEASE RECOLLECT.     NOTIFIED TO Purvis Kilts (RN) [phone] 09/17/2013 L. LOMAX   Report Status 09/17/2013 FINAL   Final  CULTURE, BLOOD (SINGLE)     Status: None   Collection Time    09/17/13  5:10 AM      Result Value Ref Range Status   Specimen Description BLOOD RIGHT ANTECUBITAL   Final   Special Requests BOTTLES DRAWN AEROBIC ONLY 10CC   Final   Culture  Setup Time     Final   Value: 09/17/2013 11:07     Performed at [facility]   Culture     Final   Value: NO GROWTH 5 DAYS     Performed at [facility]   Report Status 09/23/2013 FINAL   Final  CULTURE, BLOOD (SINGLE)     Status: None   Collection Time    09/17/13  5:25 AM      Result Value Ref Range Status   Specimen Description BLOOD RIGHT HAND   Final   Special Requests BOTTLES DRAWN AEROBIC ONLY 9CC   Final   Culture  Setup Time     Final   Value: 09/17/2013 11:07     Performed at [facility]   Culture     Final   Value: NO GROWTH 5 DAYS     Performed at [facility]   Report Status 09/23/2013 FINAL   Final       Assessment & Plan    Respiratory failure on nasal cannula 6 L per minute Aspiration pneumonia on IV antibiotics UTI on IV antibiotics Congestive heart failure with left ventricular dysfunction continue with beta blocker as digoxin and ACE inhibitors ,& diuresis Severe anemia due to hemorrhagic gastritis with xarelto Advanced COPD oxygen dependent Delirium continue with olanzapine and Haldol Pulmonary embolism status post IVC filter not an anticoagulation candidate Hyponatremia on  D5 water resolved Coronary artery disease status post MI in 2011 with multiple stents Fever Seizure on Depakote Bradycardia ; digoxin is decreased  Plan  Continue same, no change  Code Status: Full  DVT Prophylaxis  SCDs   Carron Curie M.D on 09/24/2013 at 12:08 PM

## 2013-09-25 NOTE — Progress Notes (Signed)
[facility]                                                                                              Progress note     Patient Demographics  Brad Thomas, is a 73 y.o. male  XBJ:[id number]  [id number]  DOB - 12-Feb-1941  Admit date - 09/16/2013  Admitting Physician Elnora Morrison, MD  Outpatient Primary MD for the patient is No primary provider on file.  LOS - 9   CC  Resp failure  GI BLEED  Pna          Subjective:   Brad Thomas has no complaints, rash  Objective:   Vital signs  Temperature 98 Heart rate 72 Respiratory rate 20 Blood pressure 117/63 Pulse ox 98%    Exam Awake confused [place].AT,PERRAL Supple Neck,No JVD, No cervical lymphadenopathy appriciated.  Symmetrical Chest wall movement, decreased breath sounds, no rhonchi or wheezing  RRR,No Gallops,Rubs or new Murmurs, No Parasternal Heave +ve B.Sounds, Abd Soft, Non tender, No organomegaly appriciated, No rebound - guarding or rigidity. No Cyanosis, Clubbing or edema, No new Rash or bruise     I&Os -1675  Data Review   CBC  Recent Labs Lab 09/19/13 0534 09/22/13 0621  WBC 11.2* 11.5*  HGB 10.0* 10.6*  HCT 36.3* 36.7*  PLT 203 242  MCV 90.1 86.2  MCH 24.8* 24.9*  MCHC 27.5* 28.9*  RDW 23.8* 24.1*  LYMPHSABS 0.2*  --   MONOABS 0.4  --   EOSABS 0.0  --   BASOSABS 0.0  --     Chemistries   Recent Labs Lab 09/19/13 0534 09/20/13 0925 09/22/13 0621  NA 144 145 140  K 4.5 4.3 4.2  CL 106 106 100  CO2 34* 31 32  GLUCOSE 182* 197* 86  BUN 25* 25* 24*  CREATININE 0.80 0.77 0.86  CALCIUM 8.0* 8.1* 8.3*  MG 2.7*  --   --    ------------------------------------------------------------------------------------------------------------------ CrCl is unknown because there is no height on file for the current  visit. ------------------------------------------------------------------------------------------------------------------ No results found for this basename: HGBA1C,  in the last 72 hours ------------------------------------------------------------------------------------------------------------------ No results found for this basename: CHOL, HDL, LDLCALC, TRIG, CHOLHDL, LDLDIRECT,  in the last 72 hours ------------------------------------------------------------------------------------------------------------------ No results found for this basename: TSH, T4TOTAL, FREET3, T3FREE, THYROIDAB,  in the last 72 hours ------------------------------------------------------------------------------------------------------------------ No results found for this basename: VITAMINB12, FOLATE, FERRITIN, TIBC, IRON, RETICCTPCT,  in the last 72 hours  Coagulation profile No results found for this basename: INR, PROTIME,  in the last 168 hours  No results found for this basename: DDIMER,  in the last 72 hours  Cardiac Enzymes No results found for this basename: CK, CKMB, TROPONINI, MYOGLOBIN,  in the last 168 hours ------------------------------------------------------------------------------------------------------------------ No components found with this basename: POCBNP,   Micro Results Recent Results (from the past 240 hour(s))  URINE CULTURE     Status: None   Collection Time    09/17/13  4:16 AM      Result Value Ref Range Status   Specimen Description URINE, CATHETERIZED   Final   Special Requests NONE  Final   Culture  Setup Time     Final   Value: 09/17/2013 12:51     [facility]formed at [facility]   Colony Count     Final   Value: >=100,000 COLONIES/ML     [facility]formed at [facility]   Culture     Final   Value: KLEBSIELLA PNEUMONIAE     Performed at [facility]   Report Status 09/20/2013 FINAL   Final   Organism ID, Bacteria KLEBSIELLA PNEUMONIAE   Final   CULTURE, EXPECTORATED SPUTUM-ASSESSMENT     Status: None   Collection Time    09/17/13  4:28 AM      Result Value Ref Range Status   Specimen Description SPUTUM   Final   Special Requests NONE   Final   Sputum evaluation     Final   Value: MICROSCOPIC FINDINGS SUGGEST THAT THIS SPECIMEN IS NOT REPRESENTATIVE OF LOWER RESPIRATORY SECRETIONS. PLEASE RECOLLECT.     NOTIFIED TO Purvis KiltsK. LOLLING (RN) [phone] 09/17/2013 L. LOMAX   Report Status 09/17/2013 FINAL   Final  CULTURE, BLOOD (SINGLE)     Status: None   Collection Time    09/17/13  5:10 AM      Result Value Ref Range Status   Specimen Description BLOOD RIGHT ANTECUBITAL   Final   Special Requests BOTTLES DRAWN AEROBIC ONLY 10CC   Final   Culture  Setup Time     Final   Value: 09/17/2013 11:07     Performed at [facility]   Culture     Final   Value: NO GROWTH 5 DAYS     Performed at [facility]   Report Status 09/23/2013 FINAL   Final  CULTURE, BLOOD (SINGLE)     Status: None   Collection Time    09/17/13  5:25 AM      Result Value Ref Range Status   Specimen Description BLOOD RIGHT HAND   Final   Special Requests BOTTLES DRAWN AEROBIC ONLY 9CC   Final   Culture  Setup Time     Final   Value: 09/17/2013 11:07     Performed at [facility]   Culture     Final   Value: NO GROWTH 5 DAYS     Performed at [facility]   Report Status 09/23/2013 FINAL   Final       Assessment & Plan    Respiratory failure on nasal cannula 6 L per minute Aspiration pneumonia on IV antibiotics UTI on IV antibiotics Congestive heart failure with left ventricular dysfunction continue with beta blocker as digoxin and ACE inhibitors ,& diuresis Severe anemia due to hemorrhagic gastritis with xarelto Advanced COPD oxygen dependent Delirium continue with olanzapine and Haldol Pulmonary embolism status post IVC filter not an anticoagulation candidate Hyponatremia on D5 water resolved Coronary artery disease status  post MI in 2011 with multiple stents Fever Seizure on Depakote Bradycardia ; digoxin is decreased  Plan  Start tapering amiodarone Continue same treatment  Code Status: Full  DVT Prophylaxis  SCDs   Carron CurieHijazi, Michaeljohn Biss M.D on 09/25/2013 at 2:49 PM

## 2013-09-28 NOTE — Progress Notes (Signed)
[facility]                                                                                              Progress note     Patient Demographics  Brad Thomas, is a 73 y.o. male  ZOX:[id number]  [id number]  DOB - 1940/12/03  Admit date - 09/16/2013  Admitting Physician Elnora Morrison, MD  Outpatient Primary MD for the patient is No primary provider on file.  LOS - 12   CC  Resp failure  GI BLEED  Pna          Subjective:   Brad Thomas has no new complaints,   Objective:   Vital signs  Temperature 98.5 Heart rate 51 Respiratory rate 18 Blood pressure 94/54 Pulse ox 99%    Exam Awake confused [place].AT,PERRAL Supple Neck,No JVD, No cervical lymphadenopathy appriciated.  Symmetrical Chest wall movement, decreased breath sounds, no rhonchi or wheezing  RRR,No Gallops,Rubs or new Murmurs, No Parasternal Heave +ve B.Sounds, Abd Soft, Non tender, No organomegaly appriciated, No rebound - guarding or rigidity. No Cyanosis, Clubbing or edema, No new Rash or bruise     I&Os - 350 Data Review   CBC  Recent Labs Lab 09/22/13 0621  WBC 11.5*  HGB 10.6*  HCT 36.7*  PLT 242  MCV 86.2  MCH 24.9*  MCHC 28.9*  RDW 24.1*    Chemistries   Recent Labs Lab 09/22/13 0621  NA 140  K 4.2  CL 100  CO2 32  GLUCOSE 86  BUN 24*  CREATININE 0.86  CALCIUM 8.3*   ------------------------------------------------------------------------------------------------------------------ CrCl is unknown because there is no height on file for the current visit. ------------------------------------------------------------------------------------------------------------------ No results found for this basename: HGBA1C,  in the last 72 hours ------------------------------------------------------------------------------------------------------------------ No results found for this basename:  CHOL, HDL, LDLCALC, TRIG, CHOLHDL, LDLDIRECT,  in the last 72 hours ------------------------------------------------------------------------------------------------------------------ No results found for this basename: TSH, T4TOTAL, FREET3, T3FREE, THYROIDAB,  in the last 72 hours ------------------------------------------------------------------------------------------------------------------ No results found for this basename: VITAMINB12, FOLATE, FERRITIN, TIBC, IRON, RETICCTPCT,  in the last 72 hours  Coagulation profile No results found for this basename: INR, PROTIME,  in the last 168 hours  No results found for this basename: DDIMER,  in the last 72 hours  Cardiac Enzymes No results found for this basename: CK, CKMB, TROPONINI, MYOGLOBIN,  in the last 168 hours ------------------------------------------------------------------------------------------------------------------ No components found with this basename: POCBNP,   Micro Results No results found for this or any previous visit (from the past 240 hour(s)).     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

## 2013-09-29 LAB — BASIC METABOLIC PANEL
ANION GAP: 7 (ref 5–15)
BUN: 25 mg/dL — ABNORMAL HIGH (ref 6–23)
CHLORIDE: 99 meq/L (ref 96–112)
CO2: 34 mEq/L — ABNORMAL HIGH (ref 19–32)
CREATININE: 1.15 mg/dL (ref 0.50–1.35)
Calcium: 9 mg/dL (ref 8.4–10.5)
GFR calc non Af Amer: 62 mL/min — ABNORMAL LOW (ref >90)
GFR, EST AFRICAN AMERICAN: 72 mL/min — AB (ref >90)
Glucose, Bld: 84 mg/dL (ref 70–99)
Potassium: 5.2 mEq/L (ref 3.7–5.3)
SODIUM: 140 meq/L (ref 137–147)

## 2013-10-01 LAB — BASIC METABOLIC PANEL
ANION GAP: 8 (ref 5–15)
BUN: 29 mg/dL — ABNORMAL HIGH (ref 6–23)
CALCIUM: 8.8 mg/dL (ref 8.4–10.5)
CHLORIDE: 97 meq/L (ref 96–112)
CO2: 33 mEq/L — ABNORMAL HIGH (ref 19–32)
Creatinine, Ser: 1.28 mg/dL (ref 0.50–1.35)
GFR calc non Af Amer: 54 mL/min — ABNORMAL LOW (ref >90)
GFR, EST AFRICAN AMERICAN: 63 mL/min — AB (ref >90)
Glucose, Bld: 114 mg/dL — ABNORMAL HIGH (ref 70–99)
Potassium: 5.1 mEq/L (ref 3.7–5.3)
SODIUM: 138 meq/L (ref 137–147)

## 2013-10-02 ENCOUNTER — Other Ambulatory Visit (HOSPITAL_COMMUNITY): Payer: Medicare Other

## 2013-10-02 LAB — BASIC METABOLIC PANEL
Anion gap: 7 (ref 5–15)
BUN: 29 mg/dL — ABNORMAL HIGH (ref 6–23)
CHLORIDE: 98 meq/L (ref 96–112)
CO2: 36 mEq/L — ABNORMAL HIGH (ref 19–32)
CREATININE: 1.22 mg/dL (ref 0.50–1.35)
Calcium: 8.8 mg/dL (ref 8.4–10.5)
GFR calc non Af Amer: 57 mL/min — ABNORMAL LOW (ref >90)
GFR, EST AFRICAN AMERICAN: 67 mL/min — AB (ref >90)
Glucose, Bld: 92 mg/dL (ref 70–99)
POTASSIUM: 4.9 meq/L (ref 3.7–5.3)
SODIUM: 141 meq/L (ref 137–147)

## 2013-10-02 LAB — CBC WITH DIFFERENTIAL/PLATELET
BASOS ABS: 0 10*3/uL (ref 0.0–0.1)
BASOS PCT: 0 % (ref 0–1)
Eosinophils Absolute: 0 10*3/uL (ref 0.0–0.7)
Eosinophils Relative: 0 % (ref 0–5)
HCT: 35.3 % — ABNORMAL LOW (ref 39.0–52.0)
Hemoglobin: 10.3 g/dL — ABNORMAL LOW (ref 13.0–17.0)
Lymphocytes Relative: 11 % — ABNORMAL LOW (ref 12–46)
Lymphs Abs: 1.1 10*3/uL (ref 0.7–4.0)
MCH: 25.6 pg — ABNORMAL LOW (ref 26.0–34.0)
MCHC: 29.2 g/dL — ABNORMAL LOW (ref 30.0–36.0)
MCV: 87.8 fL (ref 78.0–100.0)
Monocytes Absolute: 0.8 10*3/uL (ref 0.1–1.0)
Monocytes Relative: 8 % (ref 3–12)
NEUTROS ABS: 8.3 10*3/uL — AB (ref 1.7–7.7)
NEUTROS PCT: 81 % — AB (ref 43–77)
PLATELETS: 167 10*3/uL (ref 150–400)
RBC: 4.02 MIL/uL — ABNORMAL LOW (ref 4.22–5.81)
RDW: 22.2 % — AB (ref 11.5–15.5)
WBC: 10.3 10*3/uL (ref 4.0–10.5)

## 2013-10-02 LAB — PHOSPHORUS: PHOSPHORUS: 4 mg/dL (ref 2.3–4.6)

## 2013-10-02 LAB — MAGNESIUM: Magnesium: 2.1 mg/dL (ref 1.5–2.5)

## 2013-10-04 LAB — BASIC METABOLIC PANEL
ANION GAP: 9 (ref 5–15)
BUN: 32 mg/dL — ABNORMAL HIGH (ref 6–23)
CALCIUM: 8.6 mg/dL (ref 8.4–10.5)
CHLORIDE: 94 meq/L — AB (ref 96–112)
CO2: 33 mEq/L — ABNORMAL HIGH (ref 19–32)
CREATININE: 1.25 mg/dL (ref 0.50–1.35)
GFR calc non Af Amer: 56 mL/min — ABNORMAL LOW (ref >90)
GFR, EST AFRICAN AMERICAN: 65 mL/min — AB (ref >90)
Glucose, Bld: 88 mg/dL (ref 70–99)
Potassium: 4.8 mEq/L (ref 3.7–5.3)
Sodium: 136 mEq/L — ABNORMAL LOW (ref 137–147)

## 2013-10-04 LAB — CBC WITH DIFFERENTIAL/PLATELET
Basophils Absolute: 0 10*3/uL (ref 0.0–0.1)
Basophils Relative: 0 % (ref 0–1)
Eosinophils Absolute: 0.1 10*3/uL (ref 0.0–0.7)
Eosinophils Relative: 1 % (ref 0–5)
HCT: 36.1 % — ABNORMAL LOW (ref 39.0–52.0)
Hemoglobin: 10.8 g/dL — ABNORMAL LOW (ref 13.0–17.0)
LYMPHS ABS: 1.5 10*3/uL (ref 0.7–4.0)
Lymphocytes Relative: 13 % (ref 12–46)
MCH: 25.8 pg — ABNORMAL LOW (ref 26.0–34.0)
MCHC: 29.9 g/dL — ABNORMAL LOW (ref 30.0–36.0)
MCV: 86.2 fL (ref 78.0–100.0)
Monocytes Absolute: 0.8 10*3/uL (ref 0.1–1.0)
Monocytes Relative: 7 % (ref 3–12)
Neutro Abs: 9.3 10*3/uL — ABNORMAL HIGH (ref 1.7–7.7)
Neutrophils Relative %: 79 % — ABNORMAL HIGH (ref 43–77)
Platelets: 116 10*3/uL — ABNORMAL LOW (ref 150–400)
RBC: 4.19 MIL/uL — ABNORMAL LOW (ref 4.22–5.81)
RDW: 22.2 % — ABNORMAL HIGH (ref 11.5–15.5)
WBC: 11.7 10*3/uL — ABNORMAL HIGH (ref 4.0–10.5)

## 2013-10-04 NOTE — Progress Notes (Signed)
[facility]                                                                                              Progress note     Patient Demographics  Brad Thomas, is a 73 y.o. male  ZOX:[id number]  [id number]  DOB - 02/20/1941  Admit date - 09/16/2013  Admitting Physician Elnora Morrison, MD  Outpatient Primary MD for the patient is No primary provider on file.  LOS - 18   CC  Resp failure  GI BLEED  Pna          Subjective:   Brad Thomas has no complaints, rash  Objective:   Vital signs  Temperature 97.5 Heart rate 51 Respiratory rate 20 to Blood pressure 106/70 Pulse ox 100%    Exam Awake confused [place].AT,PERRAL Supple Neck,No JVD, No cervical lymphadenopathy appriciated.  Symmetrical Chest wall movement, decreased breath sounds, no rhonchi or wheezing  RRR,No Gallops,Rubs or new Murmurs, No Parasternal Heave +ve B.Sounds, Abd Soft, Non tender, No organomegaly appriciated, No rebound - guarding or rigidity. No Cyanosis, Clubbing or edema, No new Rash or bruise     I&Os +480  Data Review   CBC  Recent Labs Lab 10/02/13 0430 10/04/13 0610  WBC 10.3 11.7*  HGB 10.3* 10.8*  HCT 35.3* 36.1*  PLT 167 116*  MCV 87.8 86.2  MCH 25.6* 25.8*  MCHC 29.2* 29.9*  RDW 22.2* 22.2*  LYMPHSABS 1.1 1.5  MONOABS 0.8 0.8  EOSABS 0.0 0.1  BASOSABS 0.0 0.0    Chemistries   Recent Labs Lab 09/29/13 0755 10/01/13 0530 10/02/13 0430 10/04/13 0610  NA 140 138 141 136*  K 5.2 5.1 4.9 4.8  CL 99 97 98 94*  CO2 34* 33* 36* 33*  GLUCOSE 84 114* 92 88  BUN 25* 29* 29* 32*  CREATININE 1.15 1.28 1.22 1.25  CALCIUM 9.0 8.8 8.8 8.6  MG  --   --  2.1  --    ------------------------------------------------------------------------------------------------------------------ CrCl is unknown because there is no height on file for the current  visit. ------------------------------------------------------------------------------------------------------------------ No results found for this basename: HGBA1C,  in the last 72 hours ------------------------------------------------------------------------------------------------------------------ No results found for this basename: CHOL, HDL, LDLCALC, TRIG, CHOLHDL, LDLDIRECT,  in the last 72 hours ------------------------------------------------------------------------------------------------------------------ No results found for this basename: TSH, T4TOTAL, FREET3, T3FREE, THYROIDAB,  in the last 72 hours ------------------------------------------------------------------------------------------------------------------ No results found for this basename: VITAMINB12, FOLATE, FERRITIN, TIBC, IRON, RETICCTPCT,  in the last 72 hours  Coagulation profile No results found for this basename: INR, PROTIME,  in the last 168 hours  No results found for this basename: DDIMER,  in the last 72 hours  Cardiac Enzymes No results found for this basename: CK, CKMB, TROPONINI, MYOGLOBIN,  in the last 168 hours ------------------------------------------------------------------------------------------------------------------ No components found with this basename: POCBNP,   Micro Results No results found for this or any previous visit (from the past 240 hour(s)).     Assessment & Plan    Respiratory failure on nasal cannula now Aspiration pneumonia off IV antibiotics UTI on IV antibiotics Congestive heart failure with left ventricular  dysfunction continue with ACE inhibitors ,& diuresis Severe anemia due to hemorrhagic gastritis with xarelto Advanced COPD oxygen dependent Delirium resolved l Pulmonary embolism status post IVC filter not an anticoagulation candidate Hypernatremia resolved Coronary artery disease status post MI in 2011 with multiple stents Fever Seizure on  Depakote Bradycardia ; off amiodarone, beta blockers and digoxin  Plan  Continue same treatment Decrease lisinopril to 5 mg daily  Code Status: Full  DVT Prophylaxis  SCDs   Carron CurieHijazi, Oneil Behney M.D on 10/04/2013 at 1:38 PM

## 2013-10-05 NOTE — Progress Notes (Signed)
[facility]                                                                                              Progress note     Patient Demographics  Brad Thomas, is a 73 y.o. male  ZOX:[id number]  [id number]  DOB - 1940/10/26  Admit date - 09/16/2013  Admitting Physician Elnora Morrison, MD  Outpatient Primary MD for the patient is No primary provider on file.  LOS - 19   CC  Resp failure  GI BLEED  Pna          Subjective:   Brad Thomas has no complaints, rash  Objective:   Vital signs  Temperature 98.1 Heart rate 58 Respiratory rate 18 Blood pressure 108/66 Pulse ox 96 %    Exam Awake confused Brad Thomas.AT,PERRAL Supple Neck,No JVD, No cervical lymphadenopathy appriciated.  Symmetrical Chest wall movement, decreased breath sounds, no rhonchi or wheezing  RRR,No Gallops,Rubs or new Murmurs, No Parasternal Heave +ve B.Sounds, Abd Soft, Non tender, No organomegaly appriciated, No rebound - guarding or rigidity. No Cyanosis, Clubbing or edema, No new Rash or bruise     I&Os -1200  Data Review   CBC  Recent Labs Lab 10/02/13 0430 10/04/13 0610  WBC 10.3 11.7*  HGB 10.3* 10.8*  HCT 35.3* 36.1*  PLT 167 116*  MCV 87.8 86.2  MCH 25.6* 25.8*  MCHC 29.2* 29.9*  RDW 22.2* 22.2*  LYMPHSABS 1.1 1.5  MONOABS 0.8 0.8  EOSABS 0.0 0.1  BASOSABS 0.0 0.0    Chemistries   Recent Labs Lab 09/29/13 0755 10/01/13 0530 10/02/13 0430 10/04/13 0610  NA 140 138 141 136*  K 5.2 5.1 4.9 4.8  CL 99 97 98 94*  CO2 34* 33* 36* 33*  GLUCOSE 84 114* 92 88  BUN 25* 29* 29* 32*  CREATININE 1.15 1.28 1.22 1.25  CALCIUM 9.0 8.8 8.8 8.6  MG  --   --  2.1  --    ------------------------------------------------------------------------------------------------------------------ CrCl is unknown because there is no height on file for the current  visit. ------------------------------------------------------------------------------------------------------------------ No results found for this basename: HGBA1C,  in the last 72 hours ------------------------------------------------------------------------------------------------------------------ No results found for this basename: CHOL, HDL, LDLCALC, TRIG, CHOLHDL, LDLDIRECT,  in the last 72 hours ------------------------------------------------------------------------------------------------------------------ No results found for this basename: TSH, T4TOTAL, FREET3, T3FREE, THYROIDAB,  in the last 72 hours ------------------------------------------------------------------------------------------------------------------ No results found for this basename: VITAMINB12, FOLATE, FERRITIN, TIBC, IRON, RETICCTPCT,  in the last 72 hours  Coagulation profile No results found for this basename: INR, PROTIME,  in the last 168 hours  No results found for this basename: DDIMER,  in the last 72 hours  Cardiac Enzymes No results found for this basename: CK, CKMB, TROPONINI, MYOGLOBIN,  in the last 168 hours ------------------------------------------------------------------------------------------------------------------ No components found with this basename: POCBNP,   Micro Results No results found for this or any previous visit (from the past 240 hour(s)).     Assessment & Plan    Respiratory failure on nasal cannula now Aspiration pneumonia off IV antibiotics UTI on IV antibiotics Congestive heart failure with left ventricular  dysfunction continue with ACE inhibitors ,& diuresis Severe anemia due to hemorrhagic gastritis with xarelto Advanced COPD oxygen dependent Delirium resolved  Pulmonary embolism status post IVC filter not an anticoagulation candidate Hypernatremia resolved Coronary artery disease status post MI in 2011 with multiple stents Fever Seizure on  Depakote Bradycardia ; off amiodarone, beta blockers and digoxin  Plan  Hold BiPAP tonight Check ABGs in a.m. Will DC in a.m. if stable  Code Status: Full  DVT Prophylaxis  SCDs   Carron CurieHijazi, Hurshel Bouillon M.D on 10/05/2013 at 2:16 PM

## 2013-10-06 LAB — BLOOD GAS, ARTERIAL
ACID-BASE EXCESS: 6 mmol/L — AB (ref 0.0–2.0)
Bicarbonate: 30 mEq/L — ABNORMAL HIGH (ref 20.0–24.0)
O2 SAT: 94.1 %
PATIENT TEMPERATURE: 98.6
PCO2 ART: 43.1 mmHg (ref 35.0–45.0)
TCO2: 31.3 mmol/L (ref 0–100)
pH, Arterial: 7.456 — ABNORMAL HIGH (ref 7.350–7.450)
pO2, Arterial: 65.1 mmHg — ABNORMAL LOW (ref 80.0–100.0)

## 2014-02-14 DEATH — deceased

## 2014-03-17 DEATH — deceased

## 2015-01-15 IMAGING — CT CT CHEST W/O CM
2 of 4 series · 15 of 36 positions shown, 18 images · non-contrast
Comparison: Portable chest x-ray September 17, 2013

CLINICAL DATA: Abnormal chest x-ray of earlier today

EXAM:
CT CHEST WITHOUT CONTRAST
TECHNIQUE: Multidetector CT imaging of the chest was performed following the
standard protocol without IV contrast..

[Series 2: thorax 5.0 i31f 1 · axial · 0.87mm/px · z∈[+1275,+1575]mm · 12 of 70 slices shown, 15 images]
[im 5/70  mediastinal]
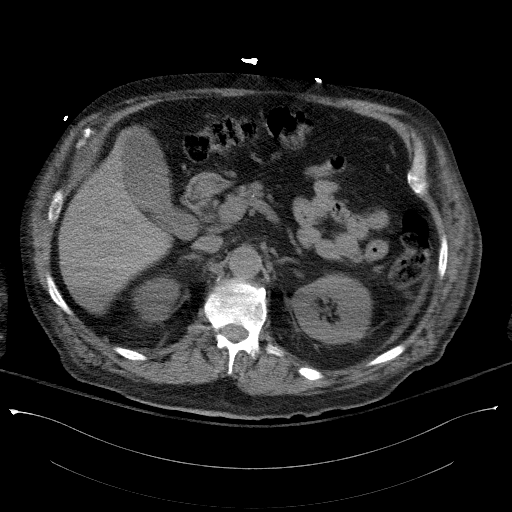
[im 5/70  lung]
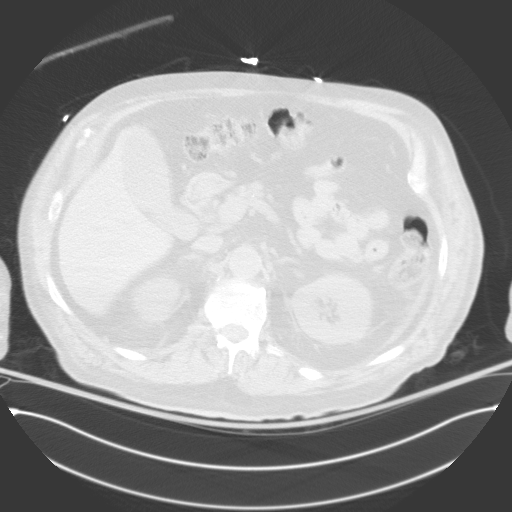
[im 10/70  lung]
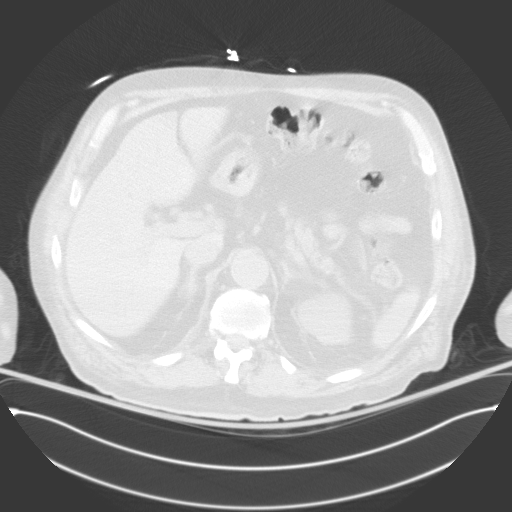
[im 15/70  lung]
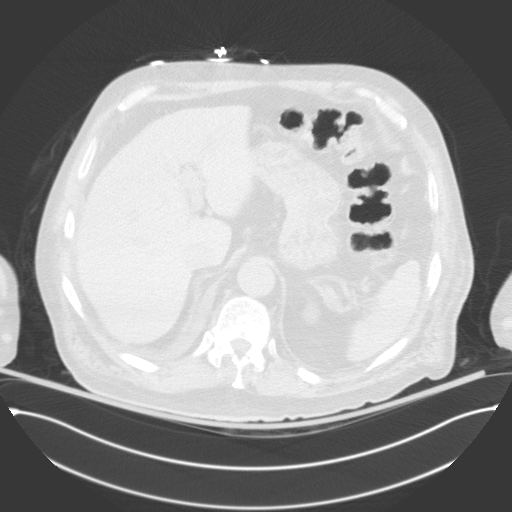
[im 20/70  lung]
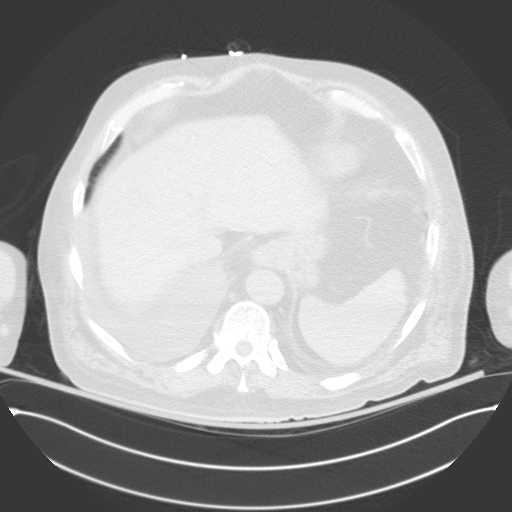
[im 25/70  mediastinal]
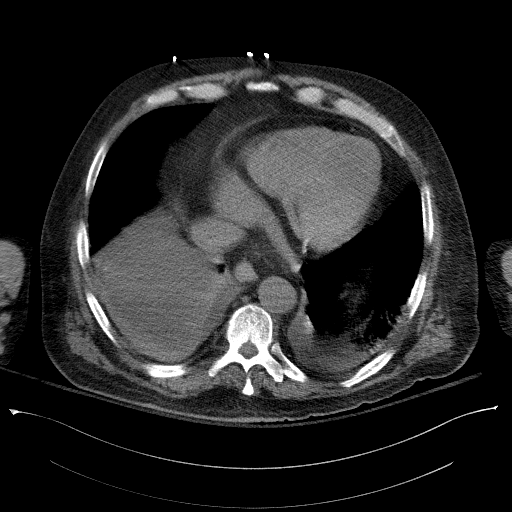
[im 25/70  lung]
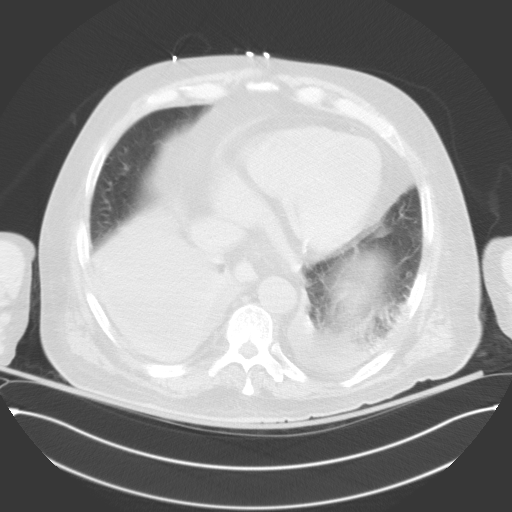
[im 30/70  lung]
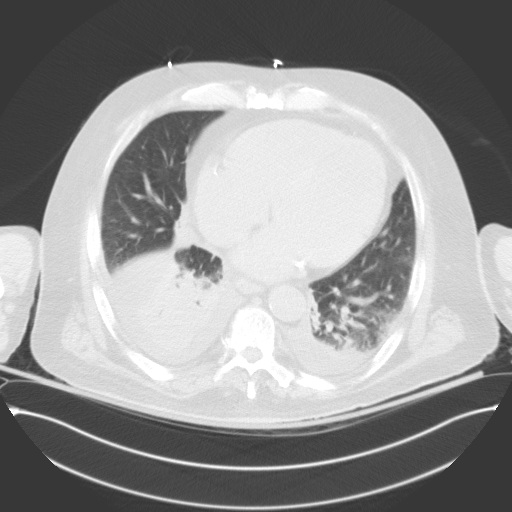
[im 40/70  lung]
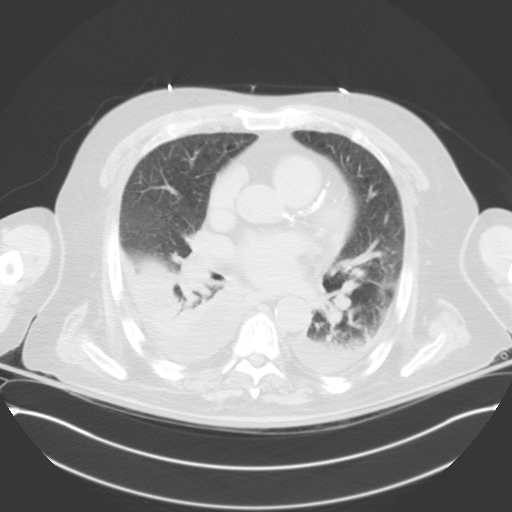
[im 45/70  lung]
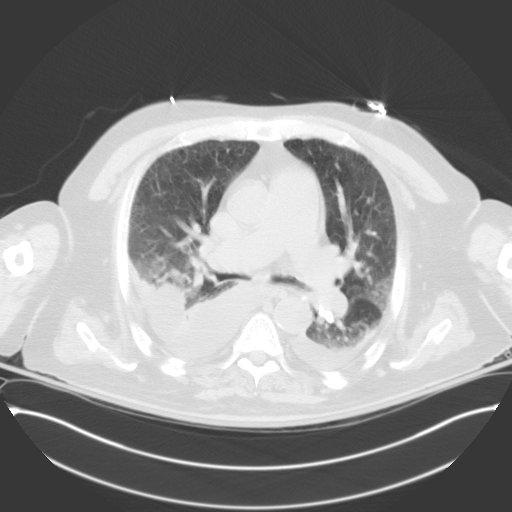
[im 50/70  mediastinal]
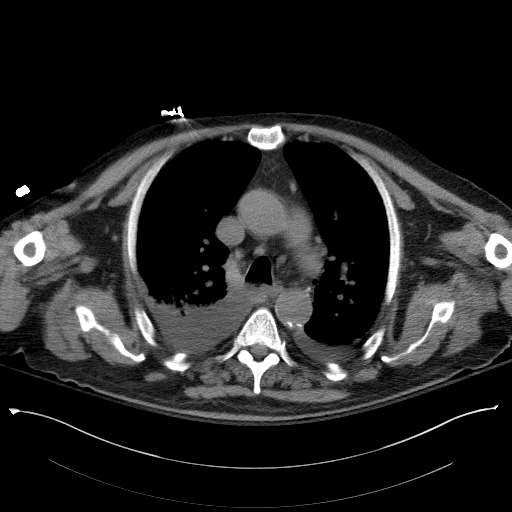
[im 50/70  lung]
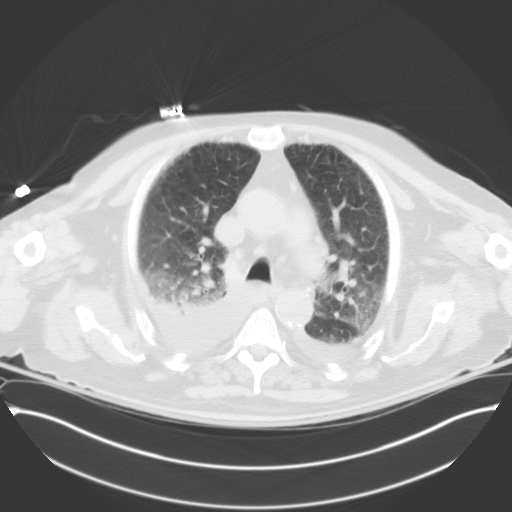
[im 55/70  lung]
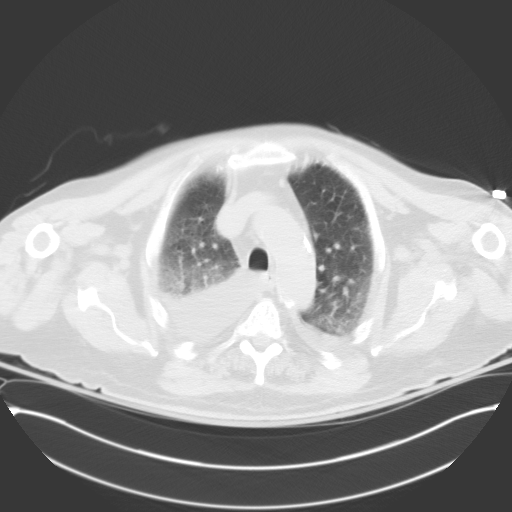
[im 60/70  lung]
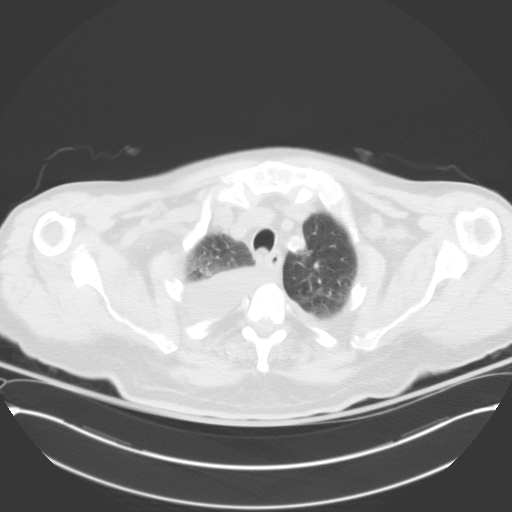
[im 65/70  lung]
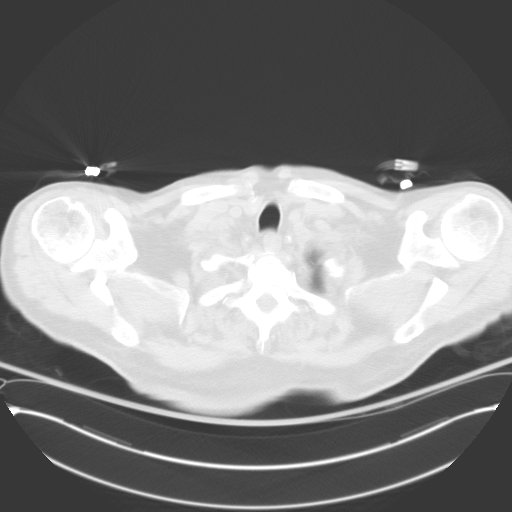

[Series 4: coronal · coronal · 0.68mm/px · 3 of 101 slices shown]
[im 21/101  lung]
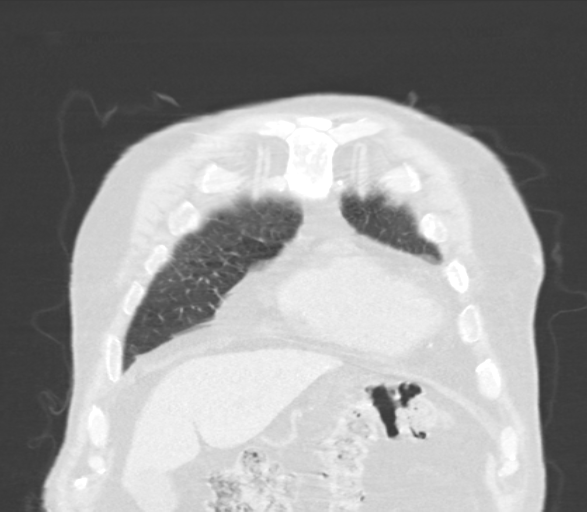
[im 41/101  lung]
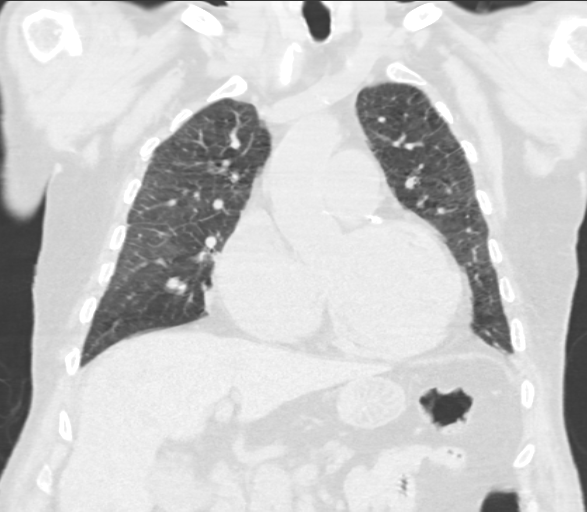
[im 61/101  lung]
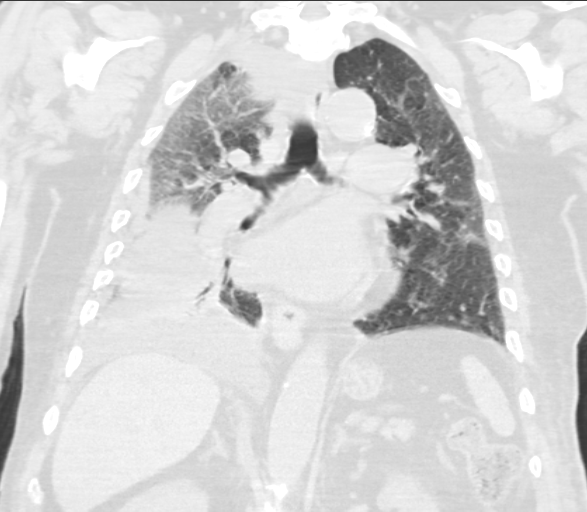

[15 of 36 positions shown; findings below may reference images not displayed]

FINDINGS: There is a moderate size right pleural effusion with smaller left
pleural effusion. There is dense consolidation of much of the right
lower lobe. This extends to the right hilum. A discrete mass is not
demonstrated. On the left there there is patchy increased density in
the lower lobe consistent with atelectasis or early pneumonia. There
are calcified posterior hilar lymph nodes and there is a calcified 8
mm nodule in the left lower lobe posteriorly.

There are coronary artery calcifications. The cardiac chambers are
enlarged. There is a lymph node anterior to the margin of the left
mainstem bronchus which measures 10 mm in short axis. The the
thoracic esophagus is normal. The partially imaged thyroid gland is
normal.

Within the upper abdomen the observed portions of the liver and
spleen and adrenal glands are normal. The bony thorax is
unremarkable.
IMPRESSION: 1. There is dense infiltrate in the right lower lobe posteriorly
consistent with pneumonia. No definite central obstructing mass is
demonstrated but the study is limited without IV contrast.
2. Bilateral pleural effusions greater on the right than on the
left.
3. Previous granulomatous infection on the left.
4. There is coronary atherosclerosis and mild cardiac chamber
enlargement without definite pulmonary edema.

## 2015-01-30 IMAGING — CR DG CHEST 1V PORT
1 series · 1 of 1 positions shown · non-contrast
Comparison: 09/22/2013

CLINICAL DATA: Pneumonia

EXAM:
PORTABLE CHEST - 1 VIEW

[AP]
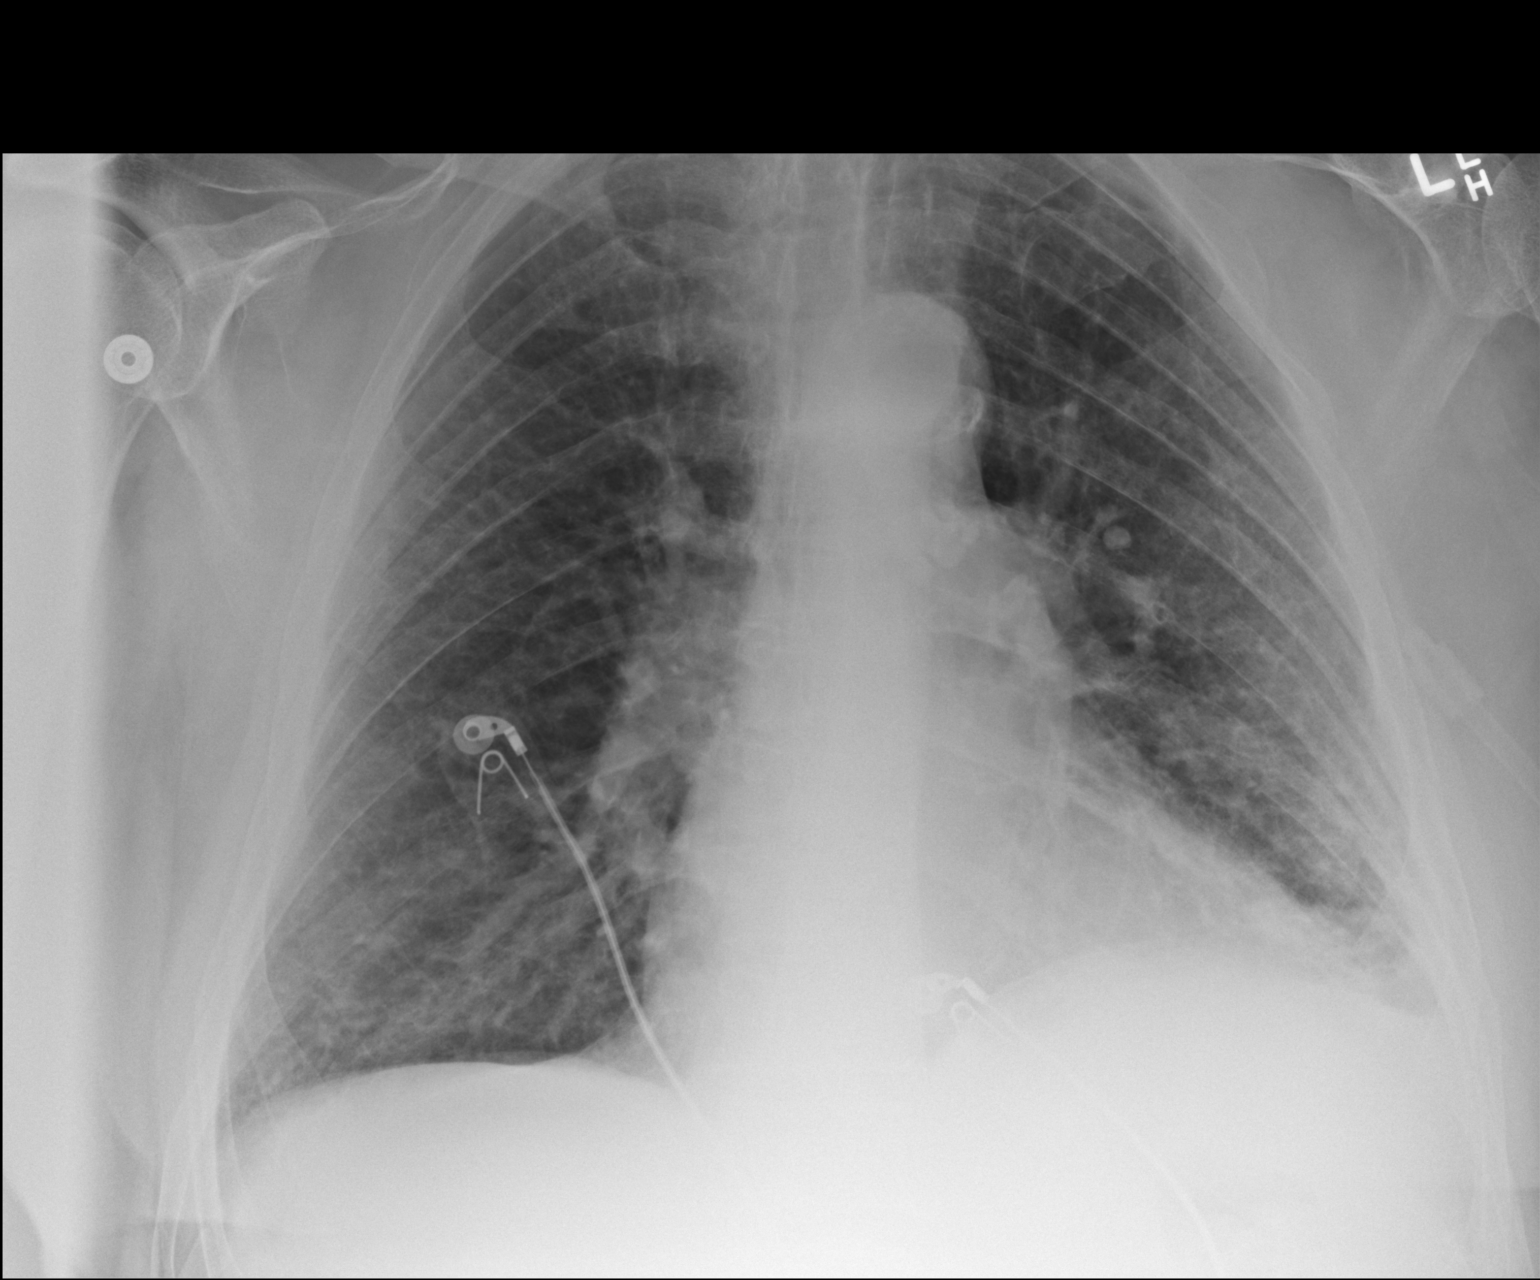

[1 of 1 positions shown; findings below may reference images not displayed]

FINDINGS: There has been significant clearing of lung base opacity
bilaterally. There is mild persistent bilateral interstitial
thickening, also improved. No new lung opacities.

Left chest tube and right PICC have been removed.  No pneumothorax.

Cardiac silhouette is normal in size. No mediastinal or hilar
masses.
IMPRESSION: Significant improved lung aeration with clearing of much of the
previously described lung base opacity. Improved interstitial edema.
